# Patient Record
Sex: Male | Born: 1975 | Race: White | Hispanic: No | Marital: Married | Smoking: Former smoker
Health system: Southern US, Community
[De-identification: ages and names within clinical notes are randomized; demographics above are authoritative.]

## PROBLEM LIST (undated history)

## (undated) HISTORY — PX: CLEFT LIP REPAIR: SHX5225

## (undated) HISTORY — PX: TYMPANOSTOMY TUBE PLACEMENT: SHX32

## (undated) HISTORY — PX: TRACHEOSTOMY: SUR1362

---

## 2007-12-02 ENCOUNTER — Encounter: Admission: RE | Admit: 2007-12-02 | Discharge: 2007-12-02 | Payer: Self-pay | Admitting: Otolaryngology

## 2010-07-26 ENCOUNTER — Other Ambulatory Visit: Payer: Self-pay | Admitting: Otolaryngology

## 2010-07-26 DIAGNOSIS — H7191 Unspecified cholesteatoma, right ear: Secondary | ICD-10-CM

## 2010-08-10 ENCOUNTER — Inpatient Hospital Stay: Admission: RE | Admit: 2010-08-10 | Payer: Self-pay | Source: Ambulatory Visit

## 2013-03-19 ENCOUNTER — Ambulatory Visit
Admission: RE | Admit: 2013-03-19 | Discharge: 2013-03-19 | Disposition: A | Discharge status: Home / Self Care | Payer: BC Managed Care – PPO | Source: Ambulatory Visit | Attending: Physician Assistant | Admitting: Physician Assistant

## 2013-03-19 ENCOUNTER — Other Ambulatory Visit: Payer: Self-pay | Admitting: Physician Assistant

## 2013-03-19 DIAGNOSIS — R609 Edema, unspecified: Secondary | ICD-10-CM

## 2013-03-19 DIAGNOSIS — T1490XA Injury, unspecified, initial encounter: Secondary | ICD-10-CM

## 2014-07-27 ENCOUNTER — Encounter (HOSPITAL_BASED_OUTPATIENT_CLINIC_OR_DEPARTMENT_OTHER): Payer: Self-pay | Admitting: *Deleted

## 2014-07-27 ENCOUNTER — Emergency Department (HOSPITAL_BASED_OUTPATIENT_CLINIC_OR_DEPARTMENT_OTHER): Payer: BLUE CROSS/BLUE SHIELD

## 2014-07-27 ENCOUNTER — Emergency Department (HOSPITAL_BASED_OUTPATIENT_CLINIC_OR_DEPARTMENT_OTHER)
Admission: EM | Admit: 2014-07-27 | Discharge: 2014-07-27 | Disposition: A | Discharge status: Home / Self Care | Payer: BLUE CROSS/BLUE SHIELD | Attending: Emergency Medicine | Admitting: Emergency Medicine

## 2014-07-27 DIAGNOSIS — Z87891 Personal history of nicotine dependence: Secondary | ICD-10-CM | POA: Diagnosis not present

## 2014-07-27 DIAGNOSIS — R0789 Other chest pain: Secondary | ICD-10-CM | POA: Insufficient documentation

## 2014-07-27 DIAGNOSIS — R079 Chest pain, unspecified: Secondary | ICD-10-CM

## 2014-07-27 MED ORDER — HYDROCODONE-ACETAMINOPHEN 5-325 MG PO TABS: 1.0000 | ORAL_TABLET | Freq: Four times a day (QID) | ORAL | Status: DC | PRN

## 2014-07-27 MED ORDER — ACETAMINOPHEN 500 MG PO TABS
1000.0000 mg | ORAL_TABLET | Freq: Once | ORAL | Status: AC
  Administered 2014-07-27: 1000 mg via ORAL
  Filled 2014-07-27: qty 2
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
## 2014-07-27 NOTE — Discharge Instructions (Signed)
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
## 2015-11-13 ENCOUNTER — Encounter (HOSPITAL_COMMUNITY): Payer: Self-pay | Admitting: *Deleted

## 2015-11-13 ENCOUNTER — Emergency Department (HOSPITAL_COMMUNITY)
Admission: EM | Admit: 2015-11-13 | Discharge: 2015-11-13 | Disposition: A | Discharge status: Home / Self Care | Payer: BLUE CROSS/BLUE SHIELD | Attending: Emergency Medicine | Admitting: Emergency Medicine

## 2015-11-13 ENCOUNTER — Emergency Department (HOSPITAL_COMMUNITY): Payer: BLUE CROSS/BLUE SHIELD

## 2015-11-13 DIAGNOSIS — M940 Chondrocostal junction syndrome [Tietze]: Secondary | ICD-10-CM

## 2015-11-13 DIAGNOSIS — Z87891 Personal history of nicotine dependence: Secondary | ICD-10-CM | POA: Diagnosis not present

## 2015-11-13 DIAGNOSIS — R109 Unspecified abdominal pain: Secondary | ICD-10-CM | POA: Diagnosis present

## 2015-11-13 LAB — CBC
HEMATOCRIT: 42.3 % (ref 39.0–52.0)
Hemoglobin: 14.6 g/dL (ref 13.0–17.0)
MCH: 30.9 pg (ref 26.0–34.0)
MCHC: 34.5 g/dL (ref 30.0–36.0)
MCV: 89.4 fL (ref 78.0–100.0)
PLATELETS: 307 10*3/uL (ref 150–400)
RBC: 4.73 MIL/uL (ref 4.22–5.81)
RDW: 13 % (ref 11.5–15.5)
WBC: 8.4 10*3/uL (ref 4.0–10.5)

## 2015-11-13 LAB — COMPREHENSIVE METABOLIC PANEL
ALBUMIN: 4.1 g/dL (ref 3.5–5.0)
ALT: 45 U/L (ref 17–63)
ANION GAP: 11 (ref 5–15)
AST: 33 U/L (ref 15–41)
Alkaline Phosphatase: 84 U/L (ref 38–126)
BILIRUBIN TOTAL: 0.9 mg/dL (ref 0.3–1.2)
BUN: 22 mg/dL — ABNORMAL HIGH (ref 6–20)
CO2: 21 mmol/L — ABNORMAL LOW (ref 22–32)
Calcium: 9.3 mg/dL (ref 8.9–10.3)
Chloride: 105 mmol/L (ref 101–111)
Creatinine, Ser: 1 mg/dL (ref 0.61–1.24)
GFR calc Af Amer: >60 mL/min (ref >60)
GFR calc non Af Amer: >60 mL/min (ref >60)
GLUCOSE: 122 mg/dL — AB (ref 65–99)
POTASSIUM: 3.9 mmol/L (ref 3.5–5.1)
SODIUM: 137 mmol/L (ref 135–145)
TOTAL PROTEIN: 7.8 g/dL (ref 6.5–8.1)

## 2015-11-13 LAB — URINALYSIS, ROUTINE W REFLEX MICROSCOPIC
BILIRUBIN URINE: NEGATIVE
GLUCOSE, UA: NEGATIVE mg/dL
HGB URINE DIPSTICK: NEGATIVE
KETONES UR: NEGATIVE mg/dL
Leukocytes, UA: NEGATIVE
Nitrite: NEGATIVE
PROTEIN: NEGATIVE mg/dL
Specific Gravity, Urine: 1.017 (ref 1.005–1.030)
pH: 6 (ref 5.0–8.0)

## 2015-11-13 LAB — LIPASE, BLOOD: Lipase: 24 U/L (ref 11–51)

## 2015-11-13 LAB — D-DIMER, QUANTITATIVE: D-Dimer, Quant: <0.27 ug/mL-FEU (ref 0.00–0.50)

## 2015-11-13 MED ORDER — KETOROLAC TROMETHAMINE 30 MG/ML IJ SOLN
30.0000 mg | Freq: Once | INTRAMUSCULAR | Status: AC
  Administered 2015-11-13: 30 mg via INTRAVENOUS
  Filled 2015-11-13: qty 1

## 2015-11-13 MED ORDER — SODIUM CHLORIDE 0.9 % IV BOLUS (SEPSIS)
1000.0000 mL | Freq: Once | INTRAVENOUS | Status: AC
  Administered 2015-11-13: 1000 mL via INTRAVENOUS

## 2015-11-13 MED ORDER — MORPHINE SULFATE (PF) 4 MG/ML IV SOLN
4.0000 mg | Freq: Once | INTRAVENOUS | Status: AC
  Administered 2015-11-13: 4 mg via INTRAVENOUS
  Filled 2015-11-13: qty 1

## 2015-11-13 NOTE — Discharge Instructions (Signed)
Please read and follow all provided instructions.  Your diagnoses today include:  1. Costochondritis     Tests performed today include: Vital signs. See below for your results today.   Medications prescribed:  Take as prescribed  You can use Ibuprofen 400mg  combined with Tylenol 1000mg  for pain relief every 6 hours. Do not exceed 4g of Tylenol in one 24 hour period. Do not exceed 10 days of this regiment.   Home care instructions:  Follow any educational materials contained in this packet.  Follow-up instructions: Please follow-up with your primary care provider for further evaluation of symptoms and treatment   Return instructions:  Please return to the Emergency Department if you do not get better, if you get worse, or new symptoms OR  - Fever (temperature greater than 101.67F)  - Bleeding that does not stop with holding pressure to the area    -Severe pain (please note that you may be more sore the day after your accident)  - Chest Pain  - Difficulty breathing  - Severe nausea or vomiting  - Inability to tolerate food and liquids  - Passing out  - Skin becoming red around your wounds  - Change in mental status (confusion or lethargy)  - New numbness or weakness    Please return if you have any other emergent concerns.  Additional Information:  Your vital signs today were: BP 142/89 (BP Location: Right Arm)    Pulse 87    Temp 98 F (36.7 C) (Oral)    Resp 16    SpO2 100%  If your blood pressure (BP) was elevated above 135/85 this visit, please have this repeated by your doctor within one month. ---------------

## 2015-11-13 NOTE — ED Triage Notes (Signed)
Pt states that he has had right flank pain for 1 hr; pt states that it woke him from sleep and is sharp and stabbing at times; pt states that he had some rt sided upper abd pain discomfort yesterday but it had resolved and then he woke up with the rt flank pain tonight; pt denies N/v; pt denies urinary sx; pt states that he is having intermittent sharp pain to rt side of his abd and states "I can feel it when I take a deep breath"

## 2015-11-13 NOTE — ED Provider Notes (Signed)
[facility]-EMERGENCY DEPT Provider Note   CSN: [id number] Arrival date & time: 11/13/15  0056  History   Chief Complaint Chief Complaint  Patient presents with  . Flank Pain    HPI Phillip Stephens is a 40 y.o. male.  HPI  40 y.o. male presents to the Emergency Department today complaining of right sided flank pain with symptoms occurring on Thursday. States that pain started as a dull ache on Thursday, but gradually worsened throughout the next few days. Noted improvement on Saturday without pain. States that pain was it worst tonight around 11pm. Pt states that it is worse with breathing as well as movement. Notes no hx same. States pain is 7/10. Minimal pain at rest. No hx DVT/PE. Denies SOB. No trauma to area. No lifting injuries. Denies worsening with PO intake. No N/V/D. No fevers. No other symptoms noted.   History reviewed. No pertinent past medical history.  There are no active problems to display for this patient.   Past Surgical History:  Procedure Laterality Date  . CLEFT LIP REPAIR    . TRACHEOSTOMY     as a child  . TYMPANOSTOMY TUBE PLACEMENT      Home Medications    Prior to Admission medications   Medication Sig Start Date End Date Taking? Authorizing Provider  HYDROcodone-acetaminophen (NORCO/VICODIN) 5-325 MG per tablet Take 1 tablet by mouth every 6 (six) hours as needed for moderate pain. 07/27/14   Azalia Bilis, MD   Family History No family history on file.  Social History Social History  Substance Use Topics  . Smoking status: Former Games developer  . Smokeless tobacco: Current User  . Alcohol use Yes     Comment: socially   Allergies   Review of patient's allergies indicates no known allergies.  Review of Systems Review of Systems ROS reviewed and all are negative for acute change except as noted in the HPI.  Physical Exam Updated Vital Signs BP 142/89 (BP Location: Right Arm)   Pulse 87   Temp 98 F (36.7 C) (Oral)   Resp 16   SpO2 100%    Physical Exam  Constitutional: He is oriented to person, place, and time. Vital signs are normal. He appears well-developed and well-nourished.  HENT:  Head: Normocephalic.  Right Ear: Hearing normal.  Left Ear: Hearing normal.  Eyes: Conjunctivae and EOM are normal. Pupils are equal, round, and reactive to light.  Neck: Normal range of motion. Neck supple.  Cardiovascular: Normal rate, regular rhythm, normal heart sounds and intact distal pulses.  Exam reveals no gallop and no friction rub.   No murmur heard. Pulmonary/Chest: Effort normal and breath sounds normal. No respiratory distress. He has no wheezes. He has no rales. He exhibits no tenderness. Left breast exhibits tenderness.  TTP along anterior right chest wall along rib space 4-5  Abdominal: Soft. Normal appearance and bowel sounds are normal. There is no rebound, no guarding, no CVA tenderness, no tenderness at McBurney's point and negative Murphy's sign.  Neurological: He is alert and oriented to person, place, and time.  Skin: Skin is warm and dry.  Psychiatric: He has a normal mood and affect. His speech is normal and behavior is normal. Thought content normal.  Nursing note and vitals reviewed.  ED Treatments / Results  Labs (all labs ordered are listed, but only abnormal results are displayed) Labs Reviewed  COMPREHENSIVE METABOLIC PANEL - Abnormal; Notable for the following:       Result Value   CO2 21 (*)  Glucose, Bld 122 (*)    BUN 22 (*)    All other components within normal limits  URINALYSIS, ROUTINE W REFLEX MICROSCOPIC (NOT AT [facility])  CBC  LIPASE, BLOOD  D-DIMER, QUANTITATIVE (NOT AT [facility])   EKG  EKG Interpretation None      Radiology Dg Chest 2 View  Result Date: 11/13/2015 CLINICAL DATA:  Shortness of breath. Pleuritic right-sided chest pain. EXAM: CHEST  2 VIEW COMPARISON:  07/27/2014 FINDINGS: The cardiomediastinal contours are normal. Pulmonary vasculature is normal. No consolidation,  pleural effusion, or pneumothorax. No acute osseous abnormalities are seen. IMPRESSION: No active cardiopulmonary disease. Electronically Signed   By: Rubye OaksMelanie  Ehinger M.D.   On: 11/13/2015 04:20    Procedures Procedures (including critical care time)  Medications Ordered in ED Medications  sodium chloride 0.9 % bolus 1,000 mL (1,000 mLs Intravenous New Bag/Given 11/13/15 0432)  morphine 4 MG/ML injection 4 mg (4 mg Intravenous Given 11/13/15 0432)   Initial Impression / Assessment and Plan / ED Course  I have reviewed the triage vital signs and the nursing notes.  Pertinent labs & imaging results that were available during my care of the patient were reviewed by me and considered in my medical decision making (see chart for details).  Clinical Course    Final Clinical Impressions(s) / ED Diagnoses  I have reviewed and evaluated the relevant laboratory values I have reviewed and evaluated the relevant imaging studies. I have reviewed the relevant previous healthcare records.I obtained HPI from historian. Patient discussed with supervising physician  ED Course:  Assessment: Pt is a 40yM who presents with right sided flank pain. Notes worsening with deep breaths. States pain is anterior and radiates towards posterior. No N/V. No trauma to area. TTP along anterior aspect. On exam, pt in NAD. Nontoxic/nonseptic appearing. VSS. Afebrile. Lungs CTA. Heart RRR. Abdomen nontender soft. Flank pain  is not likely of cardiac or pulmonary etiology d/t presentation, perc negative, VSS, no tracheal deviation, no JVD or new murmur, RRR, breath sounds equal bilaterally. Labs unremarkable. D Dimer negative. CXR unremarkable. Given analgesia in ED. Suspect costochondritis causing symptoms. D Dimer negative. Low suspicion for PE. Gall bladder non TTP. Likely costochondritis. Plan is to DC Home with follow up to PCP. At time of discharge, Patient is in no acute distress. Vital Signs are stable. Patient is able to  ambulate. Patient able to tolerate PO.   Disposition/Plan:  DC Home Additional Verbal discharge instructions given and discussed with patient.  Pt Instructed to f/u with PCP in the next week for evaluation and treatment of symptoms. Return precautions given Pt acknowledges and agrees with plan  Supervising Physician April Palumbo, MD   Final diagnoses:  Costochondritis    New Prescriptions New Prescriptions   No medications on file     Audry Piliyler Anton Cheramie, Cordelia Poche-C 11/13/15 [id number]    April Palumbo, MD 11/13/15 [phone]
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
## 2021-08-02 DIAGNOSIS — E663 Overweight: Secondary | ICD-10-CM | POA: Diagnosis not present

## 2021-08-02 DIAGNOSIS — R7303 Prediabetes: Secondary | ICD-10-CM | POA: Diagnosis not present

## 2021-08-02 DIAGNOSIS — Z0000 Encounter for general adult medical examination without abnormal findings: Secondary | ICD-10-CM | POA: Diagnosis not present

## 2021-08-02 DIAGNOSIS — E7800 Pure hypercholesterolemia, unspecified: Secondary | ICD-10-CM | POA: Diagnosis not present
